# Patient Record
Sex: Female | Born: 1955 | Race: Asian | Hispanic: No | Smoking: Never smoker
Health system: Southern US, Community
[De-identification: ages and names within clinical notes are randomized; demographics above are authoritative.]

## PROBLEM LIST (undated history)

## (undated) DIAGNOSIS — J45909 Unspecified asthma, uncomplicated: Secondary | ICD-10-CM

## (undated) DIAGNOSIS — M1990 Unspecified osteoarthritis, unspecified site: Secondary | ICD-10-CM

## (undated) DIAGNOSIS — T7840XA Allergy, unspecified, initial encounter: Secondary | ICD-10-CM

## (undated) DIAGNOSIS — E079 Disorder of thyroid, unspecified: Secondary | ICD-10-CM

## (undated) HISTORY — DX: Allergy, unspecified, initial encounter: T78.40XA

## (undated) HISTORY — DX: Unspecified osteoarthritis, unspecified site: M19.90

## (undated) HISTORY — DX: Unspecified asthma, uncomplicated: J45.909

## (undated) HISTORY — DX: Disorder of thyroid, unspecified: E07.9

---

## 2005-12-30 ENCOUNTER — Encounter: Admission: RE | Admit: 2005-12-30 | Discharge: 2005-12-30 | Payer: Self-pay | Admitting: Internal Medicine

## 2006-01-07 ENCOUNTER — Encounter: Admission: RE | Admit: 2006-01-07 | Discharge: 2006-01-07 | Payer: Self-pay | Admitting: Internal Medicine

## 2007-01-09 ENCOUNTER — Encounter: Admission: RE | Admit: 2007-01-09 | Discharge: 2007-01-09 | Payer: Self-pay | Admitting: Internal Medicine

## 2008-01-12 ENCOUNTER — Encounter: Admission: RE | Admit: 2008-01-12 | Discharge: 2008-01-12 | Payer: Self-pay | Admitting: Internal Medicine

## 2009-01-15 ENCOUNTER — Encounter: Admission: RE | Admit: 2009-01-15 | Discharge: 2009-01-15 | Payer: Self-pay | Admitting: Internal Medicine

## 2010-01-19 ENCOUNTER — Encounter: Admission: RE | Admit: 2010-01-19 | Discharge: 2010-01-19 | Payer: Self-pay | Admitting: Internal Medicine

## 2011-01-03 ENCOUNTER — Encounter: Payer: Self-pay | Admitting: Internal Medicine

## 2011-01-04 ENCOUNTER — Other Ambulatory Visit: Payer: Self-pay | Admitting: Internal Medicine

## 2011-01-04 DIAGNOSIS — Z1239 Encounter for other screening for malignant neoplasm of breast: Secondary | ICD-10-CM

## 2011-01-04 DIAGNOSIS — Z1231 Encounter for screening mammogram for malignant neoplasm of breast: Secondary | ICD-10-CM

## 2011-01-20 ENCOUNTER — Ambulatory Visit
Admission: RE | Admit: 2011-01-20 | Discharge: 2011-01-20 | Disposition: A | Discharge status: Home / Self Care | Payer: BC Managed Care – PPO | Source: Ambulatory Visit | Attending: Internal Medicine | Admitting: Internal Medicine

## 2011-01-20 DIAGNOSIS — Z1231 Encounter for screening mammogram for malignant neoplasm of breast: Secondary | ICD-10-CM

## 2012-01-03 ENCOUNTER — Other Ambulatory Visit: Payer: Self-pay | Admitting: Internal Medicine

## 2012-01-03 DIAGNOSIS — Z1231 Encounter for screening mammogram for malignant neoplasm of breast: Secondary | ICD-10-CM

## 2012-01-28 ENCOUNTER — Ambulatory Visit
Admission: RE | Admit: 2012-01-28 | Discharge: 2012-01-28 | Disposition: A | Discharge status: Home / Self Care | Payer: BC Managed Care – PPO | Source: Ambulatory Visit | Attending: Internal Medicine | Admitting: Internal Medicine

## 2012-01-28 DIAGNOSIS — Z1231 Encounter for screening mammogram for malignant neoplasm of breast: Secondary | ICD-10-CM

## 2013-03-05 ENCOUNTER — Other Ambulatory Visit: Payer: Self-pay

## 2013-03-05 DIAGNOSIS — Z1231 Encounter for screening mammogram for malignant neoplasm of breast: Secondary | ICD-10-CM

## 2013-03-22 ENCOUNTER — Ambulatory Visit
Admission: RE | Admit: 2013-03-22 | Discharge: 2013-03-22 | Disposition: A | Discharge status: Home / Self Care | Payer: BC Managed Care – PPO | Source: Ambulatory Visit

## 2013-03-22 DIAGNOSIS — Z1231 Encounter for screening mammogram for malignant neoplasm of breast: Secondary | ICD-10-CM

## 2013-07-30 ENCOUNTER — Other Ambulatory Visit (HOSPITAL_COMMUNITY): Payer: Self-pay | Admitting: Endocrinology

## 2013-07-30 DIAGNOSIS — E0590 Thyrotoxicosis, unspecified without thyrotoxic crisis or storm: Secondary | ICD-10-CM

## 2013-08-06 ENCOUNTER — Encounter (HOSPITAL_COMMUNITY)
Admission: RE | Admit: 2013-08-06 | Discharge: 2013-08-06 | Disposition: A | Discharge status: Home / Self Care | Payer: BC Managed Care – PPO | Source: Ambulatory Visit | Attending: Endocrinology | Admitting: Endocrinology

## 2013-08-06 DIAGNOSIS — E0590 Thyrotoxicosis, unspecified without thyrotoxic crisis or storm: Secondary | ICD-10-CM | POA: Insufficient documentation

## 2013-08-07 ENCOUNTER — Encounter (HOSPITAL_COMMUNITY)
Admission: RE | Admit: 2013-08-07 | Discharge: 2013-08-07 | Disposition: A | Discharge status: Home / Self Care | Payer: BC Managed Care – PPO | Source: Ambulatory Visit | Attending: Endocrinology | Admitting: Endocrinology

## 2013-08-07 MED ORDER — SODIUM PERTECHNETATE TC 99M INJECTION
9.5000 | Freq: Once | INTRAVENOUS | Status: AC | PRN
  Administered 2013-08-07: 10 via INTRAVENOUS

## 2013-08-07 MED ORDER — SODIUM IODIDE I 131 CAPSULE
7.7000 | Freq: Once | INTRAVENOUS | Status: AC | PRN
  Administered 2013-08-07: 7.7 via ORAL

## 2013-08-16 ENCOUNTER — Other Ambulatory Visit (HOSPITAL_COMMUNITY): Payer: Self-pay | Admitting: Endocrinology

## 2013-08-16 DIAGNOSIS — E0590 Thyrotoxicosis, unspecified without thyrotoxic crisis or storm: Secondary | ICD-10-CM

## 2013-08-24 ENCOUNTER — Ambulatory Visit (INDEPENDENT_AMBULATORY_CARE_PROVIDER_SITE_OTHER): Payer: BC Managed Care – PPO | Admitting: Family Medicine

## 2013-08-24 ENCOUNTER — Encounter: Payer: Self-pay | Admitting: Family Medicine

## 2013-08-24 ENCOUNTER — Encounter (HOSPITAL_COMMUNITY)
Admission: RE | Admit: 2013-08-24 | Discharge: 2013-08-24 | Disposition: A | Discharge status: Home / Self Care | Payer: BC Managed Care – PPO | Source: Ambulatory Visit | Attending: Endocrinology | Admitting: Endocrinology

## 2013-08-24 ENCOUNTER — Other Ambulatory Visit: Payer: Self-pay | Admitting: Family Medicine

## 2013-08-24 VITALS — BP 160/82 | HR 72 | Temp 98.5°F | Resp 16 | Ht 60.0 in | Wt 130.0 lb

## 2013-08-24 DIAGNOSIS — IMO0001 Reserved for inherently not codable concepts without codable children: Secondary | ICD-10-CM

## 2013-08-24 DIAGNOSIS — D649 Anemia, unspecified: Secondary | ICD-10-CM

## 2013-08-24 DIAGNOSIS — E0590 Thyrotoxicosis, unspecified without thyrotoxic crisis or storm: Secondary | ICD-10-CM

## 2013-08-24 DIAGNOSIS — Z0000 Encounter for general adult medical examination without abnormal findings: Secondary | ICD-10-CM

## 2013-08-24 LAB — LIPID PANEL
Cholesterol: 126 mg/dL (ref 0–200)
HDL: 39 mg/dL — ABNORMAL LOW (ref >39)
LDL Cholesterol: 65 mg/dL (ref 0–99)
Total CHOL/HDL Ratio: 3.2 Ratio
Triglycerides: 111 mg/dL (ref <150)
VLDL: 22 mg/dL (ref 0–40)

## 2013-08-24 LAB — COMPREHENSIVE METABOLIC PANEL
ALT: 46 U/L — ABNORMAL HIGH (ref 0–35)
AST: 24 U/L (ref 0–37)
Alkaline Phosphatase: 58 U/L (ref 39–117)
BUN: 11 mg/dL (ref 6–23)
CO2: 28 mEq/L (ref 19–32)
Creat: 0.45 mg/dL — ABNORMAL LOW (ref 0.50–1.10)
Glucose, Bld: 90 mg/dL (ref 70–99)
Potassium: 4.6 mEq/L (ref 3.5–5.3)
Total Bilirubin: 1 mg/dL (ref 0.3–1.2)

## 2013-08-24 LAB — CBC WITH DIFFERENTIAL/PLATELET
Basophils Absolute: 0 10*3/uL (ref 0.0–0.1)
Eosinophils Relative: 2 % (ref 0–5)
HCT: 34.8 % — ABNORMAL LOW (ref 36.0–46.0)
Hemoglobin: 11.1 g/dL — ABNORMAL LOW (ref 12.0–15.0)
Lymphocytes Relative: 44 % (ref 12–46)
MCHC: 31.9 g/dL (ref 30.0–36.0)
MCV: 64.3 fL — ABNORMAL LOW (ref 78.0–100.0)
Monocytes Absolute: 0.7 10*3/uL (ref 0.1–1.0)
Monocytes Relative: 10 % (ref 3–12)
Neutrophils Relative %: 43 % (ref 43–77)
Platelets: 344 10*3/uL (ref 150–400)
RBC: 5.41 MIL/uL — ABNORMAL HIGH (ref 3.87–5.11)
RDW: 15 % (ref 11.5–15.5)
WBC: 6.6 10*3/uL (ref 4.0–10.5)

## 2013-08-24 LAB — POCT URINALYSIS DIPSTICK
Bilirubin, UA: NEGATIVE
Blood, UA: NEGATIVE
Glucose, UA: NEGATIVE
Leukocytes, UA: NEGATIVE
Nitrite, UA: NEGATIVE
Protein, UA: NEGATIVE
Spec Grav, UA: <=1.005
pH, UA: 7

## 2013-08-24 LAB — IFOBT (OCCULT BLOOD): IFOBT: NEGATIVE

## 2013-08-24 LAB — POCT UA - MICROSCOPIC ONLY
Casts, Ur, LPF, POC: NEGATIVE
Mucus, UA: NEGATIVE
RBC, urine, microscopic: NEGATIVE
WBC, Ur, HPF, POC: NEGATIVE

## 2013-08-24 MED ORDER — SODIUM IODIDE I 131 CAPSULE
16.0000 | Freq: Once | INTRAVENOUS | Status: AC | PRN
  Administered 2013-08-24: 16 via ORAL

## 2013-08-24 NOTE — Patient Instructions (Addendum)
Next time you are at the pharmacy, ask them to give you your Shingles vaccine - you just need it once after your turn 57 yo. Make sure they send a copy of the vaccination record to our office.  After your thyroid treatment, make sure to have your blood pressure rechecked several times over the upcoming months to ensure it is coming down. If it remains >140/90 (on either #), please make a follow-up appointment to discuss whether you need to start on BP medications.  Keeping You Healthy  Get These Tests  Blood Pressure- Have your blood pressure checked by your healthcare provider at least once a year.  Normal blood pressure is 120/80.  Weight- Have your body mass index (BMI) calculated to screen for obesity.  BMI is a measure of body fat based on height and weight.  You can calculate your own BMI at https://www.west-esparza.com/  Cholesterol- Have your cholesterol checked every year.  Diabetes- Have your blood sugar checked every year if you have high blood pressure, high cholesterol, a family history of diabetes or if you are overweight.  Pap Smear- Have a pap smear every 1 to 3 years if you have been sexually active.  If you are older than 65 and recent pap smears have been normal you may not need additional pap smears.  In addition, if you have had a hysterectomy  For benign disease additional pap smears are not necessary.  Mammogram-Yearly mammograms are essential for early detection of breast cancer  Screening for Colon Cancer- Colonoscopy starting at age 82. Screening may begin sooner depending on your family history and other health conditions.  Follow up colonoscopy as directed by your Gastroenterologist.  Screening for Osteoporosis- Screening begins at age 53 with bone density scanning, sooner if you are at higher risk for developing Osteoporosis.  Get these medicines  Calcium with Vitamin D- Your body requires 1200-1500 mg of Calcium a day and [phone] IU of Vitamin D a day.  You can  only absorb 500 mg of Calcium at a time therefore Calcium must be taken in 2 or 3 separate doses throughout the day.  Hormones- Hormone therapy has been associated with increased risk for certain cancers and heart disease.  Talk to your healthcare provider about if you need relief from menopausal symptoms.  Aspirin- Ask your healthcare provider about taking Aspirin to prevent Heart Disease and Stroke.  Get these Immuniztions  Flu shot- Every fall  Pneumonia shot- Once after the age of 53; if you are younger ask your healthcare provider if you need a pneumonia shot.  Tetanus- Every ten years.  Zostavax- Once after the age of 46 to prevent shingles.  Take these steps  Don't smoke- Your healthcare provider can help you quit. For tips on how to quit, ask your healthcare provider or go to www.smokefree.gov or call 1-800 QUIT-NOW.  Be physically active- Exercise 5 days a week for a minimum of 30 minutes.  If you are not already physically active, start slow and gradually work up to 30 minutes of moderate physical activity.  Try walking, dancing, bike riding, swimming, etc.  Eat a healthy diet- Eat a variety of healthy foods such as fruits, vegetables, whole grains, low fat milk, low fat cheeses, yogurt, lean meats, chicken, fish, eggs, dried beans, tofu, etc.  For more information go to www.thenutritionsource.org  Dental visit- Brush and floss teeth twice daily; visit your dentist twice a year.  Eye exam- Visit your Optometrist or Ophthalmologist yearly.  Drink alcohol  in moderation- Limit alcohol intake to one drink or less a day.  Never drink and drive.  Depression- Your emotional health is as important as your physical health.  If you're feeling down or losing interest in things you normally enjoy, please talk to your healthcare provider.  Seat Belts- can save your life; always wear one  Smoke/Carbon Monoxide detectors- These detectors need to be installed on the appropriate level of  your home.  Replace batteries at least once a year.  Violence- If anyone is threatening or hurting you, please tell your healthcare provider.  Living Will/ Health care power of attorney- Discuss with your healthcare provider and family.

## 2013-08-24 NOTE — Progress Notes (Signed)
Subjective:    Patient ID: Elizabeth Cervantes, female    DOB: February 28, 1956, 57 y.o.   MRN: [id number] Chief Complaint  Patient presents with  . Annual Exam    with pap   HPI  Currently under treatment for hyperthyroid by Dr. Talmage Nap - going to her first radiation treatment today.  It was discovered by her occupational nurse when Ms. Freeney presented with a tremor.  She knows that her current SHoB, variable blood pressure, rash, leg swelling are likely all due to her hyperthyroid and is hoping this sxs will resolve over the next sev months.  Does have seasonal allergies treated with prn zyrtec.  Last pap smear was 2010 - all have been normal prior, gets annual mammogram and last was in [facility].  Colonoscopy was done around 2009 in [place] - was completely normal - no polyps - does not need repeated for 10 yrs.  Will get her flu shot at work.  Eats yogurt daily but does not take any vitamins or supplements.  Does not get any exercise daily.  Past Medical History  Diagnosis Date  . Allergy   . Arthritis   . Asthma   . Thyroid disease    No current outpatient prescriptions on file prior to visit.   No current facility-administered medications on file prior to visit.   No Known Allergies History reviewed. No pertinent past surgical history. Family History  Problem Relation Age of Onset  . Stroke Father   . Cancer Sister    History   Social History  . Marital Status: Married    Spouse Name: N/A    Number of Children: N/A  . Years of Education: N/A   Social History Main Topics  . Smoking status: Never Smoker   . Smokeless tobacco: None  . Alcohol Use: None  . Drug Use: None  . Sexual Activity: None   Other Topics Concern  . None   Social History Narrative  . None     Review of Systems  Respiratory: Positive for cough and shortness of breath.   Cardiovascular: Positive for leg swelling.  Genitourinary: Negative for vaginal bleeding, vaginal discharge,  menstrual problem and pelvic pain.  Skin: Positive for rash.  All other systems reviewed and are negative.      BP 160/82  Pulse 72  Temp(Src) 98.5 F (36.9 C) (Oral)  Resp 16  Ht 5' (1.524 m)  Wt 130 lb (58.968 kg)  BMI 25.39 kg/m2  SpO2 98% Objective:   Physical Exam  Constitutional: She is oriented to person, place, and time. She appears well-developed and well-nourished. No distress.  HENT:  Head: Normocephalic and atraumatic.  Right Ear: Tympanic membrane, external ear and ear canal normal.  Left Ear: Tympanic membrane, external ear and ear canal normal.  Nose: Mucosal edema present. No rhinorrhea.  Mouth/Throat: Uvula is midline, oropharynx is clear and moist and mucous membranes are normal. No posterior oropharyngeal erythema.  Eyes: Conjunctivae and EOM are normal. Pupils are equal, round, and reactive to light. Right eye exhibits no discharge. Left eye exhibits no discharge. No scleral icterus.  Neck: Normal range of motion. Neck supple. No thyromegaly present.  Cardiovascular: Normal rate, regular rhythm, normal heart sounds and intact distal pulses.   Pulmonary/Chest: Effort normal and breath sounds normal. No respiratory distress.  Abdominal: Soft. Bowel sounds are normal. There is no tenderness.  Genitourinary: Vagina normal and uterus normal. No breast swelling, tenderness, discharge or bleeding. Cervix exhibits friability. Cervix exhibits  no motion tenderness and no discharge. Right adnexum displays no mass and no tenderness. Left adnexum displays no mass and no tenderness.  Musculoskeletal: She exhibits no edema.  Lymphadenopathy:    She has no cervical adenopathy.  Neurological: She is alert and oriented to person, place, and time. She has normal reflexes.  Skin: Skin is warm and dry. She is not diaphoretic. No erythema.  Psychiatric: She has a normal mood and affect. Her behavior is normal.      Assessment & Plan:  Routine general medical examination at a  health care facility - Plan: Pap IG and HPV (high risk) DNA detection, Lipid panel, Comprehensive metabolic panel, CBC with Differential, IFOBT POC (occult bld, rslt in office), POCT UA - Microscopic Only, POCT urinalysis dipstick  Hyperthyroidism - starting treatment with radioiodine therapy, followed by Dr. Talmage Nap  Elevated blood pressure- likely due to hyperthyroidism - recheck after treatment.  Meds ordered this encounter  Medications  . predniSONE (DELTASONE) 10 MG tablet    Sig: Take 10 mg by mouth daily.

## 2013-08-27 LAB — PAP IG AND HPV HIGH-RISK: HPV DNA High Risk: NOT DETECTED

## 2013-08-29 ENCOUNTER — Encounter: Payer: Self-pay | Admitting: Family Medicine

## 2014-02-14 ENCOUNTER — Other Ambulatory Visit: Payer: Self-pay

## 2014-02-14 DIAGNOSIS — Z1231 Encounter for screening mammogram for malignant neoplasm of breast: Secondary | ICD-10-CM

## 2014-03-25 ENCOUNTER — Ambulatory Visit
Admission: RE | Admit: 2014-03-25 | Discharge: 2014-03-25 | Disposition: A | Discharge status: Home / Self Care | Payer: BC Managed Care – PPO | Source: Ambulatory Visit

## 2014-03-25 DIAGNOSIS — Z1231 Encounter for screening mammogram for malignant neoplasm of breast: Secondary | ICD-10-CM

## 2015-02-28 ENCOUNTER — Other Ambulatory Visit: Payer: Self-pay

## 2015-02-28 DIAGNOSIS — Z1231 Encounter for screening mammogram for malignant neoplasm of breast: Secondary | ICD-10-CM

## 2015-03-27 ENCOUNTER — Ambulatory Visit
Admission: RE | Admit: 2015-03-27 | Discharge: 2015-03-27 | Disposition: A | Discharge status: Home / Self Care | Payer: BLUE CROSS/BLUE SHIELD | Source: Ambulatory Visit

## 2015-03-27 DIAGNOSIS — Z1231 Encounter for screening mammogram for malignant neoplasm of breast: Secondary | ICD-10-CM

## 2016-03-04 ENCOUNTER — Other Ambulatory Visit: Payer: Self-pay

## 2016-03-04 DIAGNOSIS — Z1231 Encounter for screening mammogram for malignant neoplasm of breast: Secondary | ICD-10-CM

## 2016-03-29 ENCOUNTER — Ambulatory Visit
Admission: RE | Admit: 2016-03-29 | Discharge: 2016-03-29 | Disposition: A | Discharge status: Home / Self Care | Payer: BLUE CROSS/BLUE SHIELD | Source: Ambulatory Visit

## 2016-03-29 DIAGNOSIS — Z1231 Encounter for screening mammogram for malignant neoplasm of breast: Secondary | ICD-10-CM

## 2016-04-29 ENCOUNTER — Ambulatory Visit (INDEPENDENT_AMBULATORY_CARE_PROVIDER_SITE_OTHER): Payer: BLUE CROSS/BLUE SHIELD | Admitting: Family Medicine

## 2016-04-29 ENCOUNTER — Encounter: Payer: Self-pay | Admitting: Family Medicine

## 2016-04-29 VITALS — BP 142/90 | HR 49 | Temp 98.3°F | Resp 16 | Ht 61.0 in | Wt 150.0 lb

## 2016-04-29 DIAGNOSIS — IMO0001 Reserved for inherently not codable concepts without codable children: Secondary | ICD-10-CM

## 2016-04-29 DIAGNOSIS — Z1321 Encounter for screening for nutritional disorder: Secondary | ICD-10-CM

## 2016-04-29 DIAGNOSIS — Z113 Encounter for screening for infections with a predominantly sexual mode of transmission: Secondary | ICD-10-CM | POA: Diagnosis not present

## 2016-04-29 DIAGNOSIS — Z1389 Encounter for screening for other disorder: Secondary | ICD-10-CM | POA: Diagnosis not present

## 2016-04-29 DIAGNOSIS — Z1383 Encounter for screening for respiratory disorder NEC: Secondary | ICD-10-CM | POA: Diagnosis not present

## 2016-04-29 DIAGNOSIS — Z0000 Encounter for general adult medical examination without abnormal findings: Secondary | ICD-10-CM | POA: Diagnosis not present

## 2016-04-29 DIAGNOSIS — Z1239 Encounter for other screening for malignant neoplasm of breast: Secondary | ICD-10-CM | POA: Diagnosis not present

## 2016-04-29 DIAGNOSIS — R030 Elevated blood-pressure reading, without diagnosis of hypertension: Secondary | ICD-10-CM

## 2016-04-29 DIAGNOSIS — D649 Anemia, unspecified: Secondary | ICD-10-CM | POA: Diagnosis not present

## 2016-04-29 DIAGNOSIS — R945 Abnormal results of liver function studies: Secondary | ICD-10-CM

## 2016-04-29 DIAGNOSIS — Z124 Encounter for screening for malignant neoplasm of cervix: Secondary | ICD-10-CM | POA: Diagnosis not present

## 2016-04-29 DIAGNOSIS — Z136 Encounter for screening for cardiovascular disorders: Secondary | ICD-10-CM

## 2016-04-29 DIAGNOSIS — R7989 Other specified abnormal findings of blood chemistry: Secondary | ICD-10-CM

## 2016-04-29 DIAGNOSIS — E039 Hypothyroidism, unspecified: Secondary | ICD-10-CM

## 2016-04-29 LAB — COMPREHENSIVE METABOLIC PANEL
ALK PHOS: 52 U/L (ref 33–130)
ALT: 15 U/L (ref 6–29)
AST: 17 U/L (ref 10–35)
Albumin: 4.5 g/dL (ref 3.6–5.1)
BUN: 11 mg/dL (ref 7–25)
CALCIUM: 8.9 mg/dL (ref 8.6–10.4)
CHLORIDE: 103 mmol/L (ref 98–110)
CO2: 26 mmol/L (ref 20–31)
Creat: 0.54 mg/dL (ref 0.50–0.99)
GLUCOSE: 93 mg/dL (ref 65–99)
POTASSIUM: 3.9 mmol/L (ref 3.5–5.3)
Sodium: 142 mmol/L (ref 135–146)
Total Bilirubin: 0.9 mg/dL (ref 0.2–1.2)
Total Protein: 7.3 g/dL (ref 6.1–8.1)

## 2016-04-29 LAB — POCT URINALYSIS DIP (MANUAL ENTRY)
BILIRUBIN UA: NEGATIVE
Blood, UA: NEGATIVE
GLUCOSE UA: NEGATIVE
Ketones, POC UA: NEGATIVE
Leukocytes, UA: NEGATIVE
NITRITE UA: NEGATIVE
Protein Ur, POC: NEGATIVE
Spec Grav, UA: 1.01
UROBILINOGEN UA: 0.2
pH, UA: 7

## 2016-04-29 LAB — LIPID PANEL
CHOL/HDL RATIO: 3.4 ratio (ref <=5.0)
CHOLESTEROL: 171 mg/dL (ref 125–200)
HDL: 51 mg/dL (ref >=46)
LDL CALC: 93 mg/dL (ref <130)
TRIGLYCERIDES: 136 mg/dL (ref <150)
VLDL: 27 mg/dL (ref <30)

## 2016-04-29 NOTE — Patient Instructions (Addendum)
We recommend that you schedule a mammogram for breast cancer screening. Typically, you do not need a referral to do this. Please contact a local imaging center to schedule your mammogram.  [facility] - [phone]  *ask for the Radiology Department [facility]) - [phone] or [phone]  [facility] - [phone] [facility] - [phone] [facility] - [phone]  *ask for the Radiology Department [facility] - [phone]  *ask for the Radiology Department [facility] - [phone]  *ask for the Mammography Department [facility] - [phone]     IF you received an x-ray today, you will receive an invoice from [facility]. Please contact [facility] at [phone] with questions or concerns regarding your invoice.   IF you received labwork today, you will receive an invoice from [facility]. Please contact [facility] at [phone] with questions or concerns regarding your invoice.   Our billing staff will not be able to assist you with questions regarding bills from these companies.  You will be contacted with the lab results as soon as they are available. The fastest way to get your results is to activate your My Chart account. Instructions are located on the last page of this paperwork. If you have not heard from us regarding the results in 2 weeks, please contact this office.    Keeping You Healthy  Get These Tests  Blood Pressure- Have your blood pressure checked by your healthcare provider at least once a year.  Normal blood pressure is 120/80.  Weight- Have your body mass index (BMI) calculated to screen for obesity.  BMI is a measure of body fat based on height and weight.  You can calculate your own BMI at www.nhlbisupport.com/bmi/  Cholesterol- Have your cholesterol checked every year.  Diabetes- Have  your blood sugar checked every year if you have high blood pressure, high cholesterol, a family history of diabetes or if you are overweight.  Pap Test - Have a pap test every 1 to 5 years if you have been sexually active.  If you are older than 65 and recent pap tests have been normal you may not need additional pap tests.  In addition, if you have had a hysterectomy  for benign disease additional pap tests are not necessary.  Mammogram-Yearly mammograms are essential for early detection of breast cancer  Screening for Colon Cancer- Colonoscopy starting at age 50. Screening may begin sooner depending on your family history and other health conditions.  Follow up colonoscopy as directed by your Gastroenterologist.  Screening for Osteoporosis- Screening begins at age 65 with bone density scanning, sooner if you are at higher risk for developing Osteoporosis.  Get these medicines  Calcium with Vitamin D- Your body requires 1200-1500 mg of Calcium a day and 800-1000 IU of Vitamin D a day.  You can only absorb 500 mg of Calcium at a time therefore Calcium must be taken in 2 or 3 separate doses throughout the day.  Hormones- Hormone therapy has been associated with increased risk for certain cancers and heart disease.  Talk to your healthcare provider about if you need relief from menopausal symptoms.  Aspirin- Ask your healthcare provider about taking Aspirin to prevent Heart Disease and Stroke.  Get these Immuniztions  Flu shot- Every fall  Pneumonia shot- Once after the age of 65; if you are younger ask your healthcare provider if you need a pneumonia   shot.  Tetanus- Every ten years.  Zostavax- Once after the age of 60 to prevent shingles.  Take these steps  Don't smoke- Your healthcare provider can help you quit. For tips on how to quit, ask your healthcare provider or go to www.smokefree.gov or call 1-800 QUIT-NOW.  Be physically active- Exercise 5 days a week for a minimum of 30  minutes.  If you are not already physically active, start slow and gradually work up to 30 minutes of moderate physical activity.  Try walking, dancing, bike riding, swimming, etc.  Eat a healthy diet- Eat a variety of healthy foods such as fruits, vegetables, whole grains, low fat milk, low fat cheeses, yogurt, lean meats, chicken, fish, eggs, dried beans, tofu, etc.  For more information go to www.thenutritionsource.org  Dental visit- Brush and floss teeth twice daily; visit your dentist twice a year.  Eye exam- Visit your Optometrist or Ophthalmologist yearly.  Drink alcohol in moderation- Limit alcohol intake to one drink or less a day.  Never drink and drive.  Depression- Your emotional health is as important as your physical health.  If you're feeling down or losing interest in things you normally enjoy, please talk to your healthcare provider.  Seat Belts- can save your life; always wear one  Smoke/Carbon Monoxide detectors- These detectors need to be installed on the appropriate level of your home.  Replace batteries at least once a year.  Violence- If anyone is threatening or hurting you, please tell your healthcare provider.  Living Will/ Health care power of attorney- Discuss with your healthcare provider and family.  

## 2016-04-29 NOTE — Progress Notes (Signed)
Subjective:    Patient ID: Elizabeth Cervantes, female    DOB: [birth year], 60 y.o.   MRN: [id number] Chief Complaint  Patient presents with  . Annual Exam  . Gynecologic Exam    HPI Sees Dr. Talmage Nap every July for recheck on hypothyroid - has been stable  Is fasting today - she works 3rd shift so ate at 4 am   Primary  Prevention screening: CRS - 2009, was to repeat 10 years Pap - done 08/2013 - no h/o anbml Mam: done annually in April Immunizations:  Flu shot done annually Vit/Supp: Taking vtain D 2000u/d. Does drink some milk but no exercise due to time constraints.   She moves a lot at work Chiropodist.  BP at work 120/70-80s  Past Medical History  Diagnosis Date  . Allergy   . Arthritis   . Asthma   . Thyroid disease    No past surgical history on file. No current outpatient prescriptions on file prior to visit.   No current facility-administered medications on file prior to visit.   No Known Allergies Family History  Problem Relation Age of Onset  . Stroke Father   . Cancer Sister    Social History   Social History  . Marital Status: Unknown    Spouse Name: N/A  . Number of Children: N/A  . Years of Education: N/A   Occupational History  . Medical illustrator    Social History Main Topics  . Smoking status: Never Smoker   . Smokeless tobacco: None  . Alcohol Use: No  . Drug Use: No  . Sexual Activity: Not Asked   Other Topics Concern  . None   Social History Narrative   Single, no children   Does not exercise.   Depression screen PHQ 2/9 04/29/2016  Decreased Interest 0  Down, Depressed, Hopeless 0  PHQ - 2 Score 0      Review of Systems  All other systems reviewed and are negative.      Objective:  BP 158/83 mmHg  Pulse 59  Temp(Src) 98.3 F (36.8 C)  Resp 16  Ht  (1.549 m)  Wt 150 lb (68.04 kg)  BMI 28.36 kg/m2  Physical Exam  Constitutional: She is oriented to person, place, and time. She appears well-developed and  well-nourished. No distress.  HENT:  Head: Normocephalic and atraumatic.  Right Ear: Tympanic membrane, external ear and ear canal normal.  Left Ear: Tympanic membrane, external ear and ear canal normal.  Nose: Nose normal. No mucosal edema or rhinorrhea.  Mouth/Throat: Uvula is midline, oropharynx is clear and moist and mucous membranes are normal. No posterior oropharyngeal erythema.  Eyes: Conjunctivae and EOM are normal. Pupils are equal, round, and reactive to light. Right eye exhibits no discharge. Left eye exhibits no discharge. No scleral icterus.  Neck: Normal range of motion. Neck supple. No thyromegaly present.  Cardiovascular: Normal rate, regular rhythm, normal heart sounds and intact distal pulses.   Pulmonary/Chest: Effort normal and breath sounds normal. No respiratory distress.  Abdominal: Soft. Bowel sounds are normal. There is no tenderness.  Genitourinary: No breast swelling, tenderness, discharge or bleeding.  Musculoskeletal: She exhibits no edema.  Lymphadenopathy:    She has no cervical adenopathy.  Neurological: She is alert and oriented to person, place, and time. She has normal reflexes.  Skin: Skin is warm and dry. She is not diaphoretic. No erythema.  Psychiatric: She has a normal mood and affect. Her behavior is normal.  Visual Acuity Screening   Right eye Left eye Both eyes  Without correction:     With correction: 20/25 20/25 20/20        Assessment & Plan:   1. Annual physical exam   2. Routine screening for STI (sexually transmitted infection)   3. Screening for breast cancer   4. Screening for cardiovascular, respiratory, and genitourinary diseases   5. Screening for cervical cancer   6. Anemia, unspecified anemia type   7. Elevated liver function tests   8. Encounter for vitamin deficiency screening   9.      Hypothyroidism - followed by Dr. Talmage NapBalan 10.    Elevated BP - white coat - cont monitor outside office as has been  120s/70s-80s  Orders Placed This Encounter  Procedures  . Lipid panel    Order Specific Question:  Has the patient fasted?    Answer:  Yes  . Comprehensive metabolic panel    Order Specific Question:  Has the patient fasted?    Answer:  Yes  . CBC  . HIV antibody  . Hepatitis A antibody, total  . Hepatitis B core antibody, total  . Hepatitis B e antibody  . Hepatitis B surface antibody  . Hepatitis C antibody  . VITAMIN D 25 Hydroxy (Vit-D Deficiency, Fractures)  . Anemia panel  . POCT urinalysis dipstick    Meds ordered this encounter  Medications  . levothyroxine (SYNTHROID, LEVOTHROID) 75 MCG tablet    Sig: Take 75 mcg by mouth daily before breakfast.  . cholecalciferol (VITAMIN D) 1000 units tablet    Sig: Take 1,000 Units by mouth daily.    Norberto SorensonEva Rosemarie Galvis, MD MPH

## 2016-04-30 LAB — ANEMIA PANEL
%SAT: 20 % (ref 11–50)
ABS Retic: 67800 cells/uL (ref 20000–80000)
Ferritin: 110 ng/mL (ref 20–288)
Folate: 18.1 ng/mL (ref >5.4)
IRON: 73 ug/dL (ref 45–160)
RBC.: 5.65 MIL/uL — ABNORMAL HIGH (ref 3.80–5.10)
RETIC CT PCT: 1.2 %
TIBC: 370 ug/dL (ref 250–450)
UIBC: 297 ug/dL (ref 125–400)
VITAMIN B 12: 474 pg/mL (ref 200–1100)

## 2016-04-30 LAB — CBC
HEMATOCRIT: 39.4 % (ref 35.0–45.0)
HEMOGLOBIN: 12.5 g/dL (ref 11.7–15.5)
MCH: 22.1 pg — AB (ref 27.0–33.0)
MCHC: 31.7 g/dL — ABNORMAL LOW (ref 32.0–36.0)
MCV: 69.7 fL — AB (ref 80.0–100.0)
Platelets: 308 10*3/uL (ref 140–400)
RBC: 5.65 MIL/uL — AB (ref 3.80–5.10)
RDW: 16.3 % — ABNORMAL HIGH (ref 11.0–15.0)
WBC: 7.2 10*3/uL (ref 3.8–10.8)

## 2016-04-30 LAB — HEPATITIS A ANTIBODY, TOTAL: Hep A Total Ab: REACTIVE — AB

## 2016-04-30 LAB — HEPATITIS B CORE ANTIBODY, TOTAL: Hep B Core Total Ab: REACTIVE — AB

## 2016-04-30 LAB — VITAMIN D 25 HYDROXY (VIT D DEFICIENCY, FRACTURES): Vit D, 25-Hydroxy: 28 ng/mL — ABNORMAL LOW (ref 30–100)

## 2016-04-30 LAB — HEPATITIS B SURFACE ANTIBODY,QUALITATIVE: HEP B S AB: NEGATIVE

## 2016-04-30 LAB — HEPATITIS C ANTIBODY: HCV AB: NEGATIVE

## 2016-04-30 LAB — HIV ANTIBODY (ROUTINE TESTING W REFLEX): HIV: NONREACTIVE

## 2016-05-03 LAB — PAP IG AND HPV HIGH-RISK: HPV DNA High Risk: NOT DETECTED

## 2016-05-03 LAB — HEPATITIS B E ANTIBODY: HEPATITIS BE ANTIBODY: REACTIVE — AB

## 2016-05-10 DIAGNOSIS — E039 Hypothyroidism, unspecified: Secondary | ICD-10-CM | POA: Insufficient documentation

## 2017-02-28 ENCOUNTER — Other Ambulatory Visit: Payer: Self-pay | Admitting: Family Medicine

## 2017-02-28 DIAGNOSIS — Z1231 Encounter for screening mammogram for malignant neoplasm of breast: Secondary | ICD-10-CM

## 2017-03-30 ENCOUNTER — Ambulatory Visit
Admission: RE | Admit: 2017-03-30 | Discharge: 2017-03-30 | Disposition: A | Discharge status: Home / Self Care | Payer: BLUE CROSS/BLUE SHIELD | Source: Ambulatory Visit | Attending: Family Medicine | Admitting: Family Medicine

## 2017-03-30 DIAGNOSIS — Z1231 Encounter for screening mammogram for malignant neoplasm of breast: Secondary | ICD-10-CM

## 2018-03-07 ENCOUNTER — Other Ambulatory Visit: Payer: Self-pay | Admitting: Family Medicine

## 2018-03-07 DIAGNOSIS — Z1231 Encounter for screening mammogram for malignant neoplasm of breast: Secondary | ICD-10-CM

## 2018-03-09 ENCOUNTER — Other Ambulatory Visit: Payer: Self-pay | Admitting: Family

## 2018-03-21 ENCOUNTER — Other Ambulatory Visit: Payer: Self-pay | Admitting: Family

## 2018-03-21 DIAGNOSIS — E2839 Other primary ovarian failure: Secondary | ICD-10-CM

## 2018-04-06 ENCOUNTER — Ambulatory Visit: Payer: BLUE CROSS/BLUE SHIELD

## 2018-04-18 ENCOUNTER — Ambulatory Visit
Admission: RE | Admit: 2018-04-18 | Discharge: 2018-04-18 | Disposition: A | Discharge status: Home / Self Care | Payer: BLUE CROSS/BLUE SHIELD | Source: Ambulatory Visit | Attending: Family | Admitting: Family

## 2018-04-18 ENCOUNTER — Ambulatory Visit
Admission: RE | Admit: 2018-04-18 | Discharge: 2018-04-18 | Disposition: A | Discharge status: Home / Self Care | Payer: BLUE CROSS/BLUE SHIELD | Source: Ambulatory Visit | Attending: Family Medicine | Admitting: Family Medicine

## 2018-04-18 DIAGNOSIS — E2839 Other primary ovarian failure: Secondary | ICD-10-CM

## 2018-04-18 DIAGNOSIS — Z1231 Encounter for screening mammogram for malignant neoplasm of breast: Secondary | ICD-10-CM

## 2019-06-01 ENCOUNTER — Other Ambulatory Visit: Payer: Self-pay | Admitting: Family Medicine

## 2019-06-01 DIAGNOSIS — Z1231 Encounter for screening mammogram for malignant neoplasm of breast: Secondary | ICD-10-CM

## 2019-07-11 ENCOUNTER — Ambulatory Visit: Payer: BLUE CROSS/BLUE SHIELD

## 2021-02-27 DIAGNOSIS — Z6831 Body mass index (BMI) 31.0-31.9, adult: Secondary | ICD-10-CM | POA: Diagnosis not present

## 2021-02-27 DIAGNOSIS — R030 Elevated blood-pressure reading, without diagnosis of hypertension: Secondary | ICD-10-CM | POA: Diagnosis not present

## 2021-02-27 DIAGNOSIS — E669 Obesity, unspecified: Secondary | ICD-10-CM | POA: Diagnosis not present

## 2021-02-27 DIAGNOSIS — Z7982 Long term (current) use of aspirin: Secondary | ICD-10-CM | POA: Diagnosis not present

## 2021-02-27 DIAGNOSIS — E039 Hypothyroidism, unspecified: Secondary | ICD-10-CM | POA: Diagnosis not present

## 2021-02-27 DIAGNOSIS — E785 Hyperlipidemia, unspecified: Secondary | ICD-10-CM | POA: Diagnosis not present

## 2021-02-27 DIAGNOSIS — Z823 Family history of stroke: Secondary | ICD-10-CM | POA: Diagnosis not present

## 2021-02-27 DIAGNOSIS — Z809 Family history of malignant neoplasm, unspecified: Secondary | ICD-10-CM | POA: Diagnosis not present

## 2021-02-27 DIAGNOSIS — Z8249 Family history of ischemic heart disease and other diseases of the circulatory system: Secondary | ICD-10-CM | POA: Diagnosis not present

## 2021-08-07 DIAGNOSIS — R7301 Impaired fasting glucose: Secondary | ICD-10-CM | POA: Diagnosis not present

## 2021-08-07 DIAGNOSIS — E559 Vitamin D deficiency, unspecified: Secondary | ICD-10-CM | POA: Diagnosis not present

## 2021-08-07 DIAGNOSIS — E7800 Pure hypercholesterolemia, unspecified: Secondary | ICD-10-CM | POA: Diagnosis not present

## 2021-08-07 DIAGNOSIS — E890 Postprocedural hypothyroidism: Secondary | ICD-10-CM | POA: Diagnosis not present

## 2021-10-19 DIAGNOSIS — E039 Hypothyroidism, unspecified: Secondary | ICD-10-CM | POA: Diagnosis not present

## 2021-10-19 DIAGNOSIS — I10 Essential (primary) hypertension: Secondary | ICD-10-CM | POA: Diagnosis not present

## 2021-10-19 DIAGNOSIS — M810 Age-related osteoporosis without current pathological fracture: Secondary | ICD-10-CM | POA: Diagnosis not present

## 2021-10-19 DIAGNOSIS — E2839 Other primary ovarian failure: Secondary | ICD-10-CM | POA: Diagnosis not present

## 2021-10-19 DIAGNOSIS — Z23 Encounter for immunization: Secondary | ICD-10-CM | POA: Diagnosis not present

## 2021-10-19 DIAGNOSIS — Z0000 Encounter for general adult medical examination without abnormal findings: Secondary | ICD-10-CM | POA: Diagnosis not present

## 2021-10-19 DIAGNOSIS — E785 Hyperlipidemia, unspecified: Secondary | ICD-10-CM | POA: Diagnosis not present

## 2021-10-19 DIAGNOSIS — Z1231 Encounter for screening mammogram for malignant neoplasm of breast: Secondary | ICD-10-CM | POA: Diagnosis not present

## 2021-10-19 DIAGNOSIS — R7303 Prediabetes: Secondary | ICD-10-CM | POA: Diagnosis not present

## 2021-11-09 DIAGNOSIS — M810 Age-related osteoporosis without current pathological fracture: Secondary | ICD-10-CM | POA: Diagnosis not present

## 2021-12-09 DIAGNOSIS — Z23 Encounter for immunization: Secondary | ICD-10-CM | POA: Diagnosis not present

## 2021-12-09 DIAGNOSIS — M810 Age-related osteoporosis without current pathological fracture: Secondary | ICD-10-CM | POA: Diagnosis not present
